# Patient Record
Sex: Male | Born: 1935 | Race: White | Hispanic: No
Health system: Southern US, Community
[De-identification: ages and names within clinical notes are randomized; demographics above are authoritative.]

---

## 2005-01-09 ENCOUNTER — Inpatient Hospital Stay: Payer: Self-pay

## 2005-01-09 ENCOUNTER — Other Ambulatory Visit: Payer: Self-pay

## 2005-05-12 ENCOUNTER — Emergency Department: Payer: Self-pay | Admitting: General Practice

## 2005-05-12 ENCOUNTER — Other Ambulatory Visit: Payer: Self-pay

## 2005-08-14 ENCOUNTER — Inpatient Hospital Stay: Payer: Self-pay | Admitting: Internal Medicine

## 2005-08-14 ENCOUNTER — Other Ambulatory Visit: Payer: Self-pay

## 2005-08-23 ENCOUNTER — Other Ambulatory Visit: Payer: Self-pay

## 2005-08-29 ENCOUNTER — Emergency Department: Payer: Self-pay | Admitting: Emergency Medicine

## 2005-09-01 ENCOUNTER — Other Ambulatory Visit: Payer: Self-pay

## 2005-09-01 ENCOUNTER — Ambulatory Visit: Payer: Self-pay | Admitting: Specialist

## 2005-09-05 ENCOUNTER — Ambulatory Visit: Payer: Self-pay | Admitting: Specialist

## 2007-03-07 ENCOUNTER — Emergency Department: Payer: Self-pay | Admitting: Emergency Medicine

## 2007-07-15 IMAGING — CT CT ABD-PELV W/ CM
1 of 4 series · 11 of 32 positions shown, 17 images · non-contrast
Comparison: none

REASON FOR EXAM: further assess left renal mass
COMMENTS:

[Series 4: delay · axial · delayed · 0.63mm/px · z∈[-470,-70]mm · 11 of 98 slices shown, 17 images]
[im 9/98  soft-tissue]
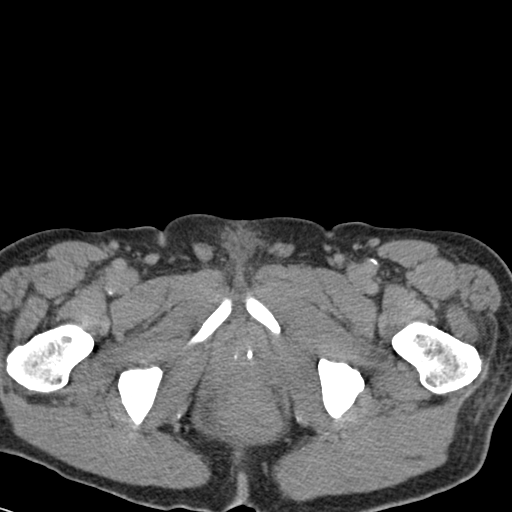
[im 9/98  bone]
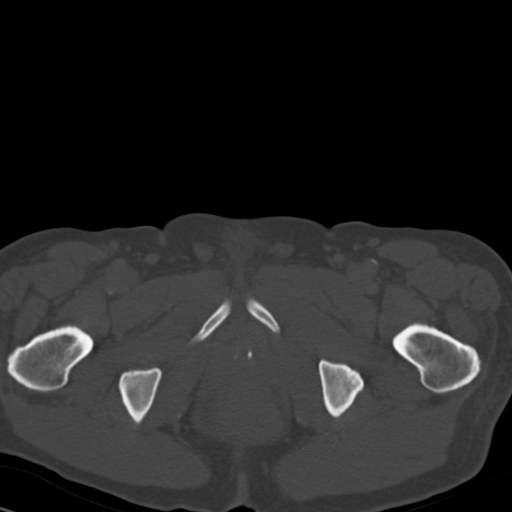
[im 17/98  soft-tissue]
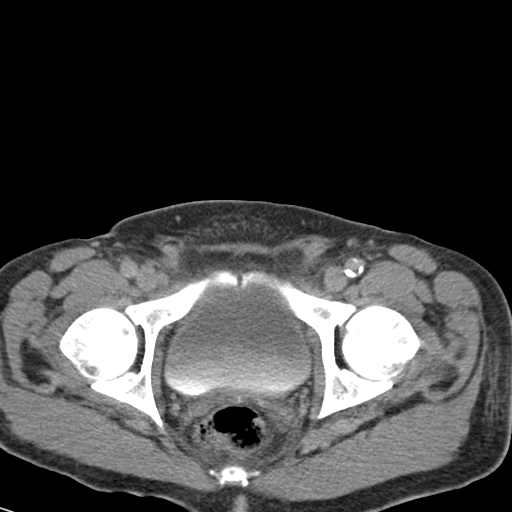
[im 25/98  soft-tissue]
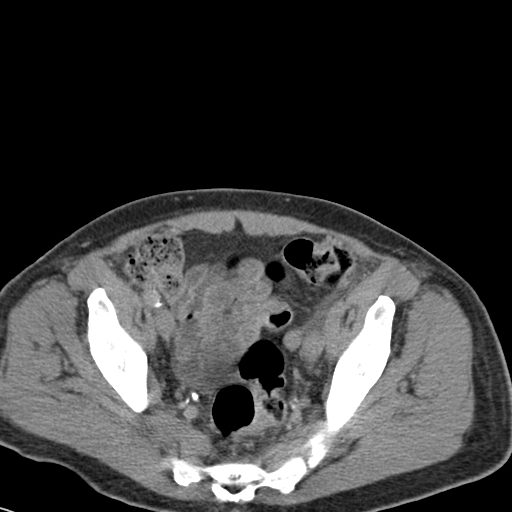
[im 33/98  soft-tissue]
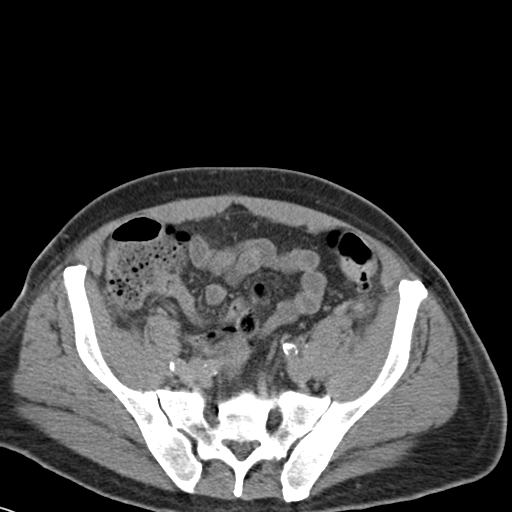
[im 41/98  soft-tissue]
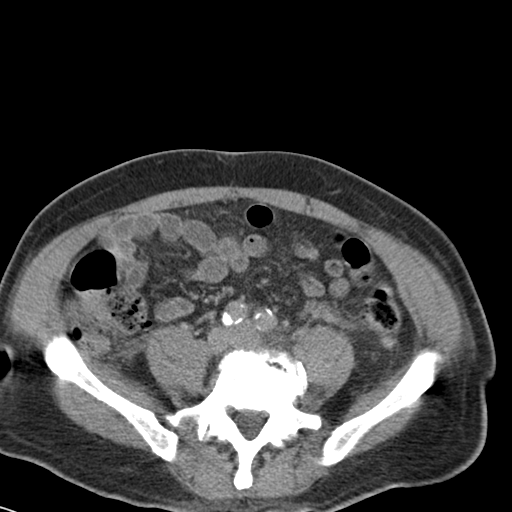
[im 49/98  soft-tissue]
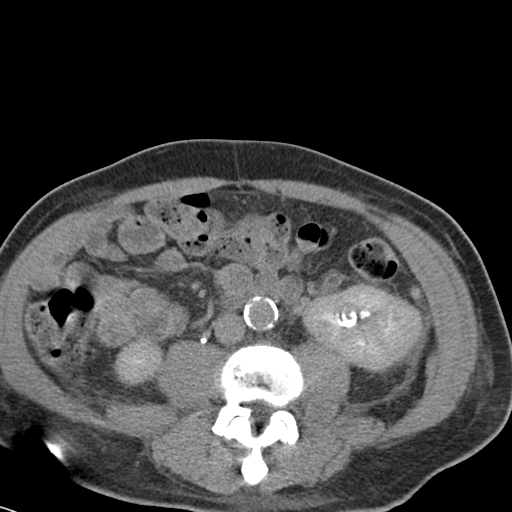
[im 57/98  soft-tissue]
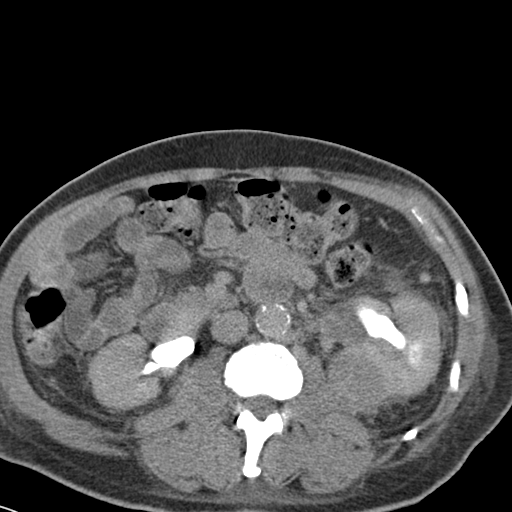
[im 65/98  soft-tissue]
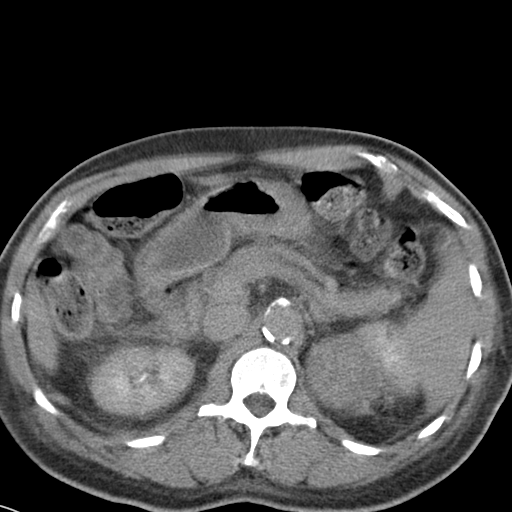
[im 65/98  lung]
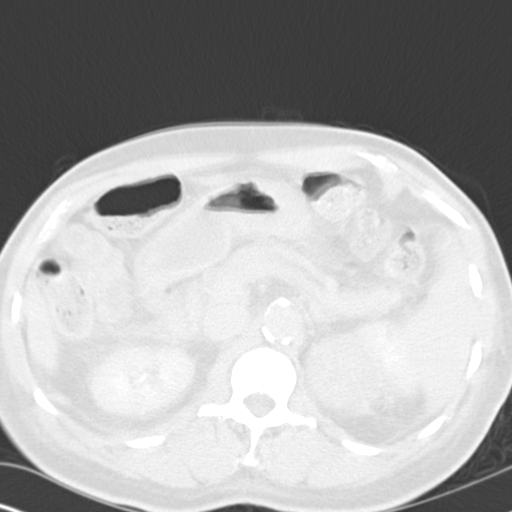
[im 73/98  soft-tissue]
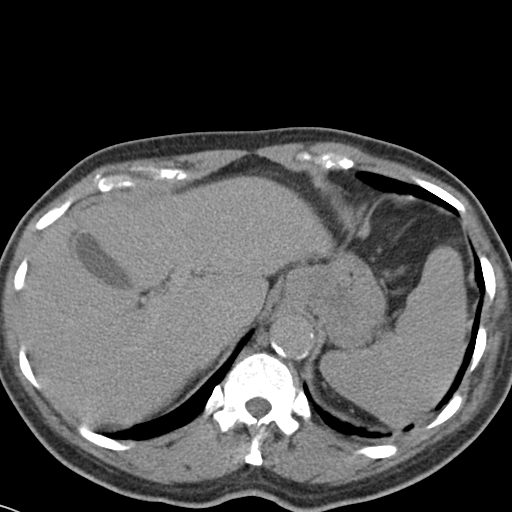
[im 73/98  lung]
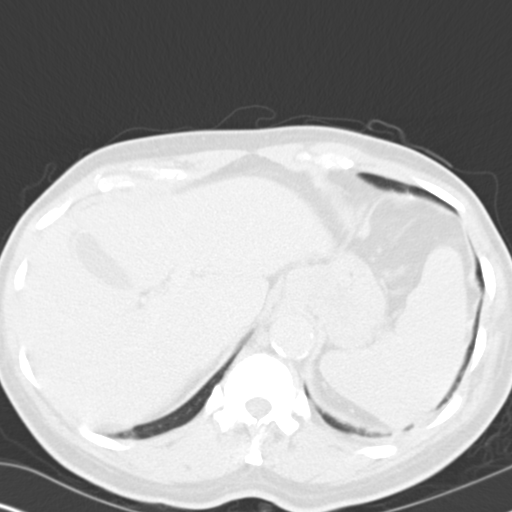
[im 73/98  bone]
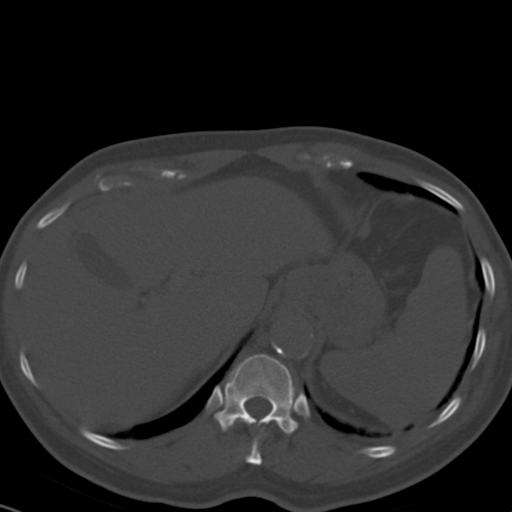
[im 81/98  soft-tissue]
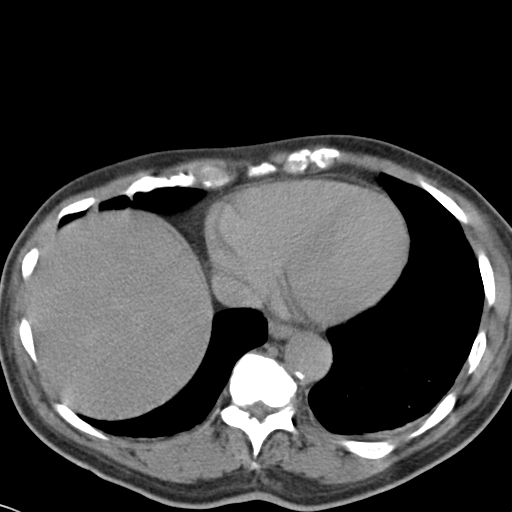
[im 81/98  lung]
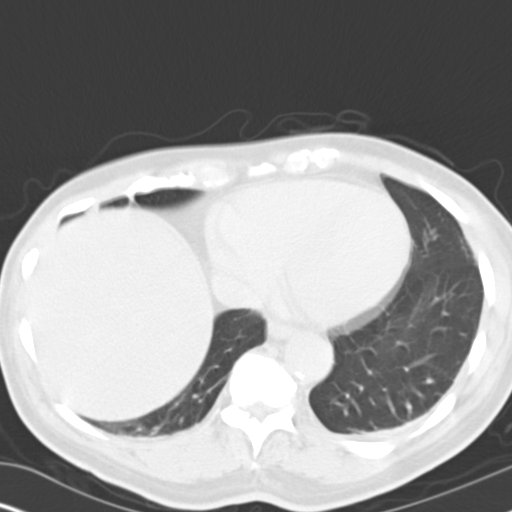
[im 89/98  soft-tissue]
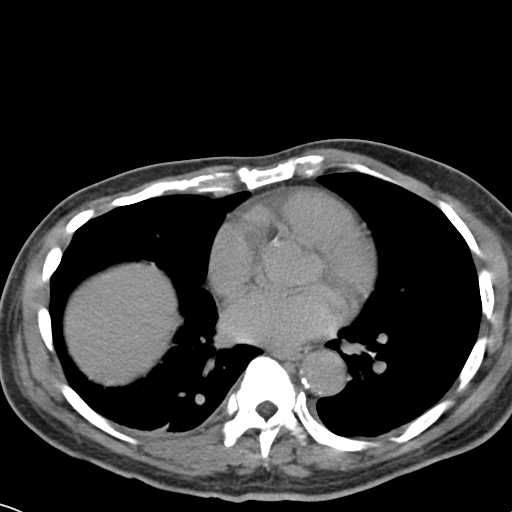
[im 89/98  lung]
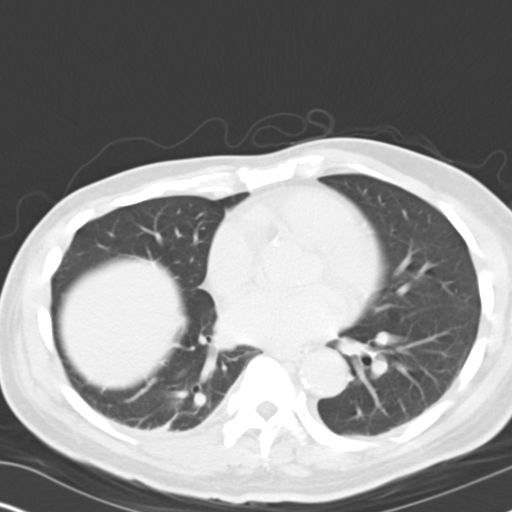

[11 of 32 positions shown; findings below may reference images not displayed]

PROCEDURE:     CT  - CT ABDOMEN / PELVIS  W  - August 20, 2005 [DATE]

RESULT:          IV contrast only CT of the abdomen and pelvis is performed.
 The patient has no prior contrast-enhanced CT for comparison.  Delayed
images were obtained.

The study demonstrates an abnormal appearance of the LEFT kidney with
lobulated areas of enhancement, especially in the upper pole region, and
evidence of hydronephrosis.  There is some calcific density seen on image
#55 measuring approximately 7.5 mm which may represent a LEFT ureteropelvic
junction stone.  Some additional minimal calcification is noted in the renal
parenchyma medially on image #45.  There is also calcification that may be
vascular on image #39.  There is hydronephrosis and perinephric stranding.
There is enlargement of the prostate with some prostatic calcification
present.  On delayed images, the kidneys excrete contrast, but the distended
collecting system does not fill completely on the initial delayed images in
the LEFT kidney.  The stone does not appear to have been present in this
ureteropelvic junction location on the LEFT when compared to the prior
study.  At that point, the density appeared to be in the lower pole caliceal
system.

The RIGHT kidney appears to enhance normally and does not show a definite
mass.  There is no evidence of a urinary tract stone on the RIGHT or
evidence of obstruction.  No definite periaortic or retroperitoneal mass or
adenopathy is seen.  No radiopaque gallstones are evident.  The liver,
spleen, pancreas and adrenal glands appear to enhance normally.  The aorta
contains atherosclerotic calcification, but there is no evidence of
aneurysmal dilatation.  No abnormal bowel distention is seen.
IMPRESSION: 1.     Findings consistent of an almost 8 mm long stone in the LEFT
ureteropelvic junction region with hydronephrosis.  The renal pelvis never
fully opacified on the delayed images, and therefore other filling defects
are difficult to evaluate.  Correlation with cystoscopy and possibly
ureteroscopy would be suggested.
2.     There is mass involving the LEFT kidney medially and superiorly which
appears to enhance and washout relatively quickly consistent with carcinoma,
possibly renal cell.  No definite RIGHT renal lesion is seen.  There is no
evidence of retroperitoneal mass or adenopathy.  The mass measures up to
about 6 cm in an oblique anterior to posterior direction.  Urologic
consultation is recommended.

## 2009-05-07 ENCOUNTER — Emergency Department: Payer: Self-pay | Admitting: Emergency Medicine

## 2012-03-14 ENCOUNTER — Ambulatory Visit: Payer: Self-pay | Admitting: Family Medicine

## 2012-03-25 ENCOUNTER — Ambulatory Visit: Payer: Self-pay | Admitting: Neurology

## 2014-04-06 ENCOUNTER — Inpatient Hospital Stay: Payer: Self-pay | Admitting: Internal Medicine

## 2014-05-26 ENCOUNTER — Emergency Department
Admit: 2014-05-26 | Disposition: A | Discharge status: Other Acute Inpatient Hospital | Payer: Self-pay | Admitting: Emergency Medicine

## 2014-05-26 LAB — COMPREHENSIVE METABOLIC PANEL
ALBUMIN: 2.8 g/dL — AB
ANION GAP: 9 (ref 7–16)
AST: 24 U/L
Alkaline Phosphatase: 82 U/L
BUN: 33 mg/dL — ABNORMAL HIGH
Bilirubin,Total: 0.8 mg/dL
CO2: 26 mmol/L
Calcium, Total: 8.5 mg/dL — ABNORMAL LOW
Chloride: 109 mmol/L
Creatinine: 2.18 mg/dL — ABNORMAL HIGH
GFR CALC AF AMER: 31 — AB
GFR CALC NON AF AMER: 27 — AB
GLUCOSE: 209 mg/dL — AB
Potassium: 2.8 mmol/L — ABNORMAL LOW
SGPT (ALT): 17 U/L
Sodium: 144 mmol/L
Total Protein: 7.6 g/dL

## 2014-05-26 LAB — URINALYSIS, COMPLETE
Bilirubin,UR: NEGATIVE
Glucose,UR: NEGATIVE mg/dL (ref 0–75)
KETONE: NEGATIVE
Nitrite: POSITIVE
Ph: 5 (ref 4.5–8.0)
SPECIFIC GRAVITY: 1.005 (ref 1.003–1.030)

## 2014-05-26 LAB — ED INFLUENZA
INFLAPCR: NEGATIVE
Influenza B By PCR: NEGATIVE

## 2014-05-26 LAB — LACTIC ACID, PLASMA
LACTIC ACID, VENOUS: 1.5 mmol/L
Lactic Acid, Venous: 2.3 mmol/L

## 2014-05-26 LAB — CBC WITH DIFFERENTIAL/PLATELET
BASOS PCT: 0.2 %
Basophil #: 0 10*3/uL (ref 0.0–0.1)
Eosinophil #: 0 10*3/uL (ref 0.0–0.7)
Eosinophil %: 0.2 %
HCT: 28.8 % — ABNORMAL LOW (ref 40.0–52.0)
HGB: 9.3 g/dL — AB (ref 13.0–18.0)
LYMPHS ABS: 0.8 10*3/uL — AB (ref 1.0–3.6)
Lymphocyte %: 12.3 %
MCH: 27.8 pg (ref 26.0–34.0)
MCHC: 32.3 g/dL (ref 32.0–36.0)
MCV: 86 fL (ref 80–100)
MONO ABS: 0.4 x10 3/mm (ref 0.2–1.0)
Monocyte %: 5.9 %
Neutrophil #: 5.4 10*3/uL (ref 1.4–6.5)
Neutrophil %: 81.4 %
Platelet: 204 10*3/uL (ref 150–440)
RBC: 3.35 10*6/uL — ABNORMAL LOW (ref 4.40–5.90)
RDW: 15.6 % — ABNORMAL HIGH (ref 11.5–14.5)
WBC: 6.7 10*3/uL (ref 3.8–10.6)

## 2014-05-26 LAB — TROPONIN I
Troponin-I: 0.04 ng/mL — ABNORMAL HIGH
Troponin-I: 0.05 ng/mL — ABNORMAL HIGH

## 2014-05-26 LAB — PHOSPHORUS: PHOSPHORUS: 1.9 mg/dL — AB

## 2014-05-26 LAB — PROTIME-INR
INR: 1.2
PROTHROMBIN TIME: 15.4 s — AB

## 2014-05-26 LAB — MAGNESIUM: MAGNESIUM: 1.7 mg/dL

## 2014-05-28 LAB — URINE CULTURE

## 2014-05-31 LAB — CULTURE, BLOOD (SINGLE)

## 2014-06-14 NOTE — Consult Note (Signed)
Psychiatry: Came by to see patient this evening but he was off the floor at radiology.  I will follow-up tomorrow morning.  Electronic Signatures: Tija Biss, Jackquline DenmarkJohn T (MD)  (Signed on 23-Feb-16 18:58)  Authored  Last Updated: 23-Feb-16 18:58 by Audery Amellapacs, Joffre Lucks T (MD)

## 2014-06-14 NOTE — Consult Note (Signed)
Brief Consult Note: Diagnosis: Proctitis, persistent diarrhea.   Patient was seen by consultant.   Comments: Mr. Dwayne Dominguez is a very pleasant 81 y/o male patient of the [facility] in [place] with recent hospitalization for GI bleed which he states was secondary to PUD & hx colon cancer about 5-6 years ago who presents with 2 1/2 weeks of profuse diarrhea & fecal incontinence.  Pt had oral contrast only CT that shows rectal wall thickening with questionable fistula between rectum & poster perineum with irregularity suggesting decubitus ulceration.  No large decubiti visualized on rectal exam.  Differentials include acute proctitis/colitis secondary to infection, ischemia, or recurrent malignancy.    Plan: 1) Agree with full set of stool studies 2) Request [facility] records 3) Supportive measures 4) Consider flexible sigmoidoscopy versus complete colonoscopy with Dr Servando SnareWohl.  Discussed risks/benefits of procedure which include but are not limited to bleeding, infection, perforation & drug reaction.  I will discuss with Dr Servando SnareWohl further 5) Follow H/H 6) Protonix 40mg  BID given recent UGI bleed history Thanks for allowing us to participate in his care.  Please see full dictated note. #[id number].  Electronic Signatures: Joselyn ArrowJones, Kayleah Appleyard L (NP)  (Signed [phone] 366322-Feb-16 16:35)  Authored: Brief Consult Note   Last Updated: 22-Feb-16 16:35 by Joselyn ArrowJones, Brentt Fread L (NP)

## 2014-06-14 NOTE — Consult Note (Signed)
PATIENT NAME:  Dwayne DittyIERCE, Dwayne W MR#:  [id number] DATE OF BIRTH:  1926-10-12  DATE OF CONSULTATION:  04/06/2014  REQUESTING PHYSICIAN:  Alford Highlandichard Wieting, MD CONSULTING PHYSICIAN:  Joselyn ArrowKandice L. Kionna Brier, NP  PRIMARY CARE PHYSICIAN:  [facility] [place].   CONSULTING GASTROENTEROLOGIST: Midge Miniumarren Wohl, MD   PRIMARY GASTROENTEROLOGIST:  [facility] [place]  REASON FOR CONSULTATION: Diarrhea, proctitis on CT.   HISTORY OF PRESENT ILLNESS: Mr. Jeanella Crazeierce is an 81 year old male who is followed by the [facility] in [place].  He gives history of bleeding ulcer and states about a month ago he was admitted to the hospital for 2-1/2 weeks.  He is unsure whether he had an EGD but tells me he was told he had bleeding ulcer.  He then went to [facility] for 2-1/2 weeks and was discharged home.  Over the last 2-1/2 weeks, he has had diarrhea all night and all day. He has oozing incontinence.  He denies any rectal bleeding, melena or mucus in his stools. He gives a history of colon resection for colon cancer 5 to 6 years ago at the [facility], but I do not have these records. He cannot give me details as far as how much of his colon was removed nor whether he had chemotherapy or radiation. He denies any NSAIDs. He does state that he has lost several pounds in the last few weeks.  He has been on iron ferrous sulfate and is on Aggrenox twice daily. He denies nausea and vomiting, heartburn or indigestion, dysphagia or odynophagia. He reports his last colonoscopy was at the [facility] last year and was negative.  He had a CT scan with oral contrast only given his allergies to IV contrast.  It showed thickening of the rectal wall concerning for proctitis, which may warrant direct visualization, a questionable fistula between the rectum and the posterior perineum with irregularity in the wall of peritoneum suggesting decubitus ulcerations, left testes is seen in the inguinal canal, status post left nephrectomy, scattered sigmoid  diverticula without diverticulitis, extensive atherosclerotic change, and a ventral hernia containing only fat.   PAST MEDICAL AND SURGICAL HISTORY: Iron deficiency anemia, renal cancer status post left nephrectomy, colon cancer status post partial colectomy, CVA, right eye prosthesis, renal lithiasis, chronic kidney disease, diabetes mellitus, gout, hypertension and hypoalbuminemia.    MEDICATIONS PRIOR TO ADMISSION:  Allopurinol 100 mg daily, Norvasc 10 mg daily, vitamin C daily, Aggrenox 1 tablet b.i.d., Lipitor 80 mg at bedtime, iron 325 mg b.i.d., hydrochlorothiazide 25 mg daily, lisinopril 40 mg daily, melatonin 3 mg at bedtime, metoprolol-ER 25 mg daily 70-30 insulin, unknown dose.  ALLERGIES: IV CONTRAST DYE.   FAMILY HISTORY: Father deceased at age 355 due to tuberculosis. Mother deceased at 2592 due to old age.  No known family history of carcinoma, liver or chronic GI problems.   SOCIAL HISTORY: He is a widower lives alone. He has someone who comes and helps with cooking and cleaning around the house. He was previously a Runner, broadcasting/film/videoteacher at [employer]. He denies any tobacco. He rarely consumes alcohol and denies any illicit drug use.  REVIEW OF SYSTEMS:   NEUROLOGIC:  He has chronic left-sided deficits status post CVA.    HEENT: He has decreased hearing.  MUSCULOSKELETAL: He has chronic knee pain and says he needs knee replacements, but states he is too old for this. All other is negative except as mentioned in HPI.  PHYSICAL EXAMINATION:   VITAL SIGNS:  BMI 26.5, weight 185 pounds, height 70 inches,  temperature 97.5, pulse 96, respirations 20, blood pressure 131/74, oxygen saturation 99% on room air.  GENERAL: He is an elderly, thin black male who is alert, oriented, pleasant, cooperative. He is accompanied by his cousin today.  HEENT: Sclerae clear, anicteric. Conjunctivae pink. Oropharynx pink and moist without any lesions.  NECK: Supple without any mass or thyromegaly.  CHEST:  Heart regular rate and rhythm. Normal S1, S2 without murmurs, clicks, rubs, or gallops.  LUNGS: Clear to auscultation bilaterally.  ABDOMEN: Positive bowel sounds x 4. No bruits auscultated. Abdomen is soft, nondistended, nontender, without palpable mass or hepatosplenomegaly. No rebound, tenderness or guarding.  RECTAL: No external lesions or decubiti visualized and internal exam was not performed as he was found to be Hemoccult positive from the Emergency Room staff.  EXTREMITIES:  No edema. No cyanosis.  NEUROLOGIC: Left-sided deficits with musculoskeletal left-sided weakness, good 5/5 strength on the right, 3/5 strength on the left.  NEUROLOGIC: Grossly intact. He is alert and oriented x 4.  PSYCHIATRIC: Alert, cooperative, normal mood and affect.   LABORATORY STUDIES: Glucose 149, BUN 37, creatinine 2.13, chloride 108, otherwise normal basic metabolic panel. Albumin 3, otherwise normal LFTs. Troponin was negative. Hemoglobin 9.6, hematocrit 29.9, platelets normal.  White blood cell count normal.   IMPRESSION: Mr. Falzone is a very pleasant 81 year old male patient of the [address] [facility] in [place], [place] with a recent hospitalization for gastrointestinal bleed which he states was secondary to peptic ulcer disease and a history of colon cancer about 5 to 6 years ago who presents with 2-1/2 weeks of profuse diarrhea and fecal incontinence. The patient had oral contrast only. CT that showed rectal wall thickening with questionable fistula between rectal and posterior perineum with irregularity suggesting decubitus ulceration. No large decubiti visualized on rectal exam.  Differentials include acute proctitis colitis secondary to infection, ischemia, or recurrent malignancy.   PLAN:  1.  Agree with full set of stool studies.  2.  Request [facility] records.  3.  Supportive measures.  4.  Consider flexible sigmoidoscopy versus complete colonoscopy with Dr. Servando Snare.  5.  Follow hemoglobin and  hematocrit.  6.  Protonix 40 mg b.i.d. given recent upper gastrointestinal bleed.   Thank you for allowing [place] to participate in his care.      ____________________________ Joselyn Arrow, NP klj:DT D: 04/06/2014 16:34:11 ET T: 04/06/2014 17:23:48 ET JOB#: [id number]  cc: Joselyn Arrow, NP, <Dictator> [facility]     Joselyn Arrow FNP ELECTRONICALLY SIGNED 04/16/2014 21:11

## 2014-06-14 NOTE — H&P (Signed)
PATIENT NAME:  Dwayne Dominguez, Dwayne Dominguez MR#:  [id number] DATE OF BIRTH:  [birth year]  DATE OF ADMISSION:  04/06/2014  [facility]  CHIEF COMPLAINT: Diarrhea for the past 2-1/2 weeks, mostly going on at night.   HISTORY OF PRESENT ILLNESS: This is an 81 year old man who states he was recently discharged from the [facility] with a bleeding ulcer and was over at [facility] for while and now back home. He states for the past 2-1/2 weeks when he goes to bed he has been having oozing sensation from his bowels, not getting any better. He is having diarrhea after meals. He had a CT scan that was done today that showed possible proctitis. ER physician had him guaiac positive black stool. Hospitalist services were contacted for further evaluation. The patient is not having any abdominal pain. No nausea or vomiting. His diarrhea is the major complaint.    PAST MEDICAL HISTORY: Kidney cancer, colon cancer status post resection, right eye prosthesis, kidney stones, chronic kidney disease, diabetes, hypertension.   PAST SURGICAL HISTORY: Kidney removal, colon resection and kidney stone surgery.   ALLERGIES: No known drug allergies.   MEDICATIONS: As per prescription writer: Allopurinol 100 mg daily, Norvasc 10 mg daily, vitamin C daily, Aggrenox 1 tablet twice a day, Lipitor 80 mg at bedtime, iron 325 mg twice a day, hydrochlorothiazide 25 mg daily, lisinopril 40 mg daily, melatonin 3 mg at bedtime, metoprolol ER 25 mg daily, 70/30 insulin unknown dose.   SOCIAL HISTORY: No smoking. Occasional alcohol. No drug use. Worked as a Runner, broadcasting/film/videoteacher in the past. Lives by himself.   FAMILY HISTORY: Father at 4155 died of TB. Mother died at 3392 of old age.   REVIEW OF SYSTEMS: CONSTITUTIONAL: Positive for weight loss. No fever, chills, or sweats.  EYES: He does wear glasses.  EARS, NOSE, MOUTH AND THROAT: Decreased hearing. No sore throat. No difficulty swallowing.  CARDIOVASCULAR: No chest pain. No  palpitations.  RESPIRATORY: No shortness of breath. No cough. No sputum. No hemoptysis.  GASTROINTESTINAL: Positive for diarrhea. No abdominal pain. No nausea. No vomiting. That he sees, no blood in the stool.  GENITOURINARY: No burning on urination or hematuria.  MUSCULOSKELETAL: No joint pain or muscle pain.  INTEGUMENT: No rashes or eruptions.  NEUROLOGIC: No fainting or blackouts.  PSYCHIATRIC: No anxiety or depression.  ENDOCRINE: No thyroid problems.  HEMATOLOGIC AND LYMPHATIC: Positive for anemia.   PHYSICAL EXAMINATION:  VITAL SIGNS: Stable and reviewed prior to seeing the patient, but computers are down so I cannot give you the exact numbers right now.  GENERAL: No respiratory distress.  EYES: Conjunctivae and lids normal. Pupils equal, round, and reactive to light. Extraocular muscles intact. No nystagmus.  EARS, NOSE, MOUTH AND THROAT: Tympanic membranes: No erythema. Nasal mucosa: No erythema. Throat: No erythema. No exudate seen. Lips and gums: No lesions.  NECK: No JVD. No bruits. No lymphadenopathy. No thyromegaly. No thyroid nodules palpated.  RESPIRATORY: Lungs clear to auscultation. No use of accessory muscles to breathe. No rhonchi, rales, or wheeze heard.  CARDIOVASCULAR: S1, S2 normal. A +2/6 systolic ejection murmur. Carotid upstroke 2+ bilaterally. No bruises. Dorsalis pedis pulses 1+ bilaterally. No edema of the lower extremity.  ABDOMEN: Soft, nontender. No organosplenomegaly. Normoactive bowel sounds. ER physician stated he did a rectal that was black stool, guaiac-positive.  EXTREMITIES: No clubbing, cyanosis, edema.  INTEGUMENT: No rashes or eruptions.  NEUROLOGIC: Cranial nerves II through XII grossly intact. Deep tendon reflexes 2+ bilateral lower extremities.  PSYCHIATRIC:  The patient is alert, oriented to person, place, and time.   LABORATORY AND RADIOLOGICAL DATA: I do not have access to the computer so I cannot tell you all the laboratory data at this point.  I wrote down creatinine 2.13, hemoglobin 9.6.  As per the ER physician, CT scan showed proctitis and colonic thickening.   EKG: First-degree AV block, right bundle branch block, left axis deviation.   ASSESSMENT AND PLAN: 1.  Diarrhea with possible proctitis, possible gastrointestinal bleed. We will admit to medicine, get a gastroenterology consultation, empirically put on Cipro and Flagyl. Get stool culture, stool for Clostridium difficile, stool leukocytes. Serial hemoglobins and continue to monitor closely.  2.  Recent gastrointestinal bleed over at the [place]. Try to obtain records.  3.  Hypertension history. Continue Norvasc and metoprolol.  4.  Looks like chronic kidney disease. Continue to monitor while here.  5.  Gout. On allopurinol.  6.  Hyperlipidemia. On high-dose Lipitor.  7.  Diabetes. We will check finger sticks at this point.  8.  Probable history of stroke. On Aggrenox. Will hold that at this point.  CODE STATUS: FULL.  TIME SPENT ON ADMISSION: 55 minutes.  ____________________________ Herschell Dimes. Renae Gloss, MD rjw:sb D: 04/06/2014 13:38:48 ET T: 04/06/2014 13:46:48 ET JOB#: [id number]  cc: Herschell Dimes. Renae Gloss, MD, <Dictator> [facility] Salley Scarlet MD ELECTRONICALLY SIGNED 04/07/2014 16:40

## 2014-06-14 NOTE — Discharge Summary (Signed)
PATIENT NAME:  Dwayne Dominguez, Dwayne Dominguez MR#:  [id number] DATE OF BIRTH:  01-05-1927  DATE OF ADMISSION:  04/06/2014 DATE OF DISCHARGE:    ADMITTING PHYSICIAN:  Dr. Alford Highlandichard Wieting.    DISCHARGING PHYSICIAN:  Dr. Enid Baasadhika Tonnie Stillman.    PRIMARY CARE PHYSIC[facility]  At [facility].   CONSULTATIONS IN THE HOSPITAL:  1.  GI consultation by Dr. Midge Miniumarren Wohl.  2.  Psychiatric consultation by Dr. Toni Amendlapacs.    DISCHARGE DIAGNOSES:  1. Sepsis from Escherichia coli urinary tract infection.  2. Diarrhea, cultures negative, improved now.  3. Hypertension.  4. Mild to moderate dementia.  5. Gout.  6. Acute on chronic anemia requiring 1 unit packed red blood cell transfusion this admission.  7. Recent Helicobacter pylori ulcer treatment.  8. Chronic kidney disease stage III.  9. Metabolic encephalopathy.  10. History of cerebrovascular accident with right-sided weakness.  11. 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  12. History of colon cancer status post right hemicolectomy.  13. History of renal cell carcinoma status post nephrectomy.   DISCHARGE MEDICATIONS:  1. Ascorbic acid 500 mg p.o. 3 times a day.  2. Atorvastatin 80 mg p.o. at bedtime.  3. Aggrenox 25/200 mg 1 capsule p.o. b.i.d.  4. Ferrous sulfate 325 mg p.o. 3 times a day.  5. Novolin units 18 subcutaneous once a day in the evening.  6. Novolin insulin 70/30, 22 units in the morning subcutaneous.  7. Lisinopril 40 mg p.o. daily.  8. Metoprolol 25 mg p.o. b.i.d.  9. Allopurinol 100 mg p.o. daily.  10. Melatonin 3 mg p.o. at bedtime.  11. Norvasc 5 mg p.o. daily.  12. Protonix 40 mg p.o. b.i.d.  13. Flagyl 500 mg p.o. q. 8 hours for 6 days.  14. Percocet 5/325 mg 1 tablet q. 6 hours p.r.n. for pain.  15. Keflex 250 mg p.o. 3 times a day for 5 more days.      DISCHARGE DIET: Low- sodium diet.    DISCHARGE ACTIVITY: As tolerated.    FOLLOWUP INSTRUCTIONS:  1. PCP follow-up at the [facility] in 1-2  weeks.  2. GI followup for anemia in 2 weeks.  3. Physical therapy.   LABORATORIES AND IMAGING STUDIES PRIOR TO DISCHARGE:  1.  WBC 5.3, hemoglobin 9.3, hematocrit 28.2, platelet count is 127,000.  2.  Sodium 140, potassium 3.5, chloride 109, bicarbonate 21, BUN 17, creatinine 1.8, glucose 156, calcium of 8.4. HbA1c is 6.2.  3.  Urinalysis was nitrite positive, 3 + leukocyte esterase, 3 + bacteria and WBCs, and cultures growing greater than 100,000 colonies of Escherichia coli resistant to fluoroquinolones.   4.  Chest x-ray showing low lung volumes, bibasilar atelectasis.  5.  Blood cultures negative.   6.  Knee x-ray showing degenerative arthritis, no effusion.  7.  Stool cultures are negative. Stool for Clostridium difficile antigen is positive, PCR is negative.  8.  CT of the abdomen and pelvis on admission showing no bowel obstruction, no abscess, status post left nephrectomy, thickening of the rectal wall concerning for proctitis, however flexible sigmoidoscopy was done and proctitis was ruled out.   BRIEF HOSPITAL COURSE: Mr. Jeanella Crazeierce is an 81 year old African-American gentleman with past medical history significant for multiple medical problems including left nephrectomy for renal cell cancer, colon cancer status post resection, CKD, diabetes, hypertension, anemia with [facility] for EGD and colonoscopy showing Helicobacter pylori and gastric ulcers, status post blood  transfusions at the time, was discharged from [facility] hospital and went to rehabilitation, however signed out from rehabilitation and went home. He comes to the hospital secondary to diarrhea and weakness.   1.  Diarrhea, could have been from antibiotics that were given at the [facility] for his proctitis.  His Clostridium difficile was negative for PCR but GDH antigen came positive, either way started on Flagyl with improvement in his symptoms.  So he is being discharged on Flagyl to finish out the 10  day course.  At this time he does not have diarrhea. He had a complete colonoscopy done at the [facility] hospital in January 2016 which was normal except for they recommended 3 years surveillance with his history of colon cancer in the past. Flexible sigmoidoscopy was attempted here in the hospital, however nonconclusive, but no proctitis noted as noted on the CAT scan, but it was a poor preparation. His diarrhea stopped, he is able to eat and no issues.  2.  Sepsis from Escherichia coli UTI. The patient had proctitis and was treated with antibiotics, however had fevers again, cultures were done from the urine and it grew Escherichia coli which are related to  fluoroquinolones. His antibiotics were change to Rocephin here in the hospital and he is being discharged on Keflex.  3.  Metabolic encephalopathy with a history of dementia. The patient has mild-to-moderate dementia at baseline, was confused day 2 of admission, however this started improving once sepsis improved. He was actually seen by psychiatry to clear him for making his own decisions per daughter's request, however the patient was deemed competent to make his own decisions though sometimes he would have poor judgment. He was oriented and knew the consequences and knew what was going on with him. So psychiatry deemed him competent to make his own medical conditions.  4.  Acute on chronic anemia, known chronic anemia, hemoglobin baseline is around 8. He received 2 units of packed RBC  transfusion during [facility] hospital stay, it seems like he had an EGD which showed Helicobacter pylori positive and gastric ulcer, which was nonbleeding.  He is on PPI b.i.d. which is being continued, ferrous sulfate supplements. He did receive 1 unit packed RBC transfusion and hemoglobin at 9 at this time.  5.  CKD states III, has been stable.  6.  His course has been otherwise uneventful in the hospital. The patient did work with physical therapy who recommended rehabilitation for  him.   DISCHARGE CONDITION: Guarded.   DISCHARGE DISPOSITION: Short-term rehabilitation.   TIME SPENT ON DISCHARGE: 45 minutes.   CODE STATUS: Full code    ____________________________ Enid Baas, MD rk:bu D: 04/10/2014 13:15:13 ET T: 04/10/2014 13:38:48 ET JOB#: [id number]  cc: Enid Baas, MD, <Dictator> [facility] Enid Baas MD ELECTRONICALLY SIGNED 04/30/2014 17:48

## 2014-06-14 NOTE — Consult Note (Signed)
Chief Complaint:  Subjective/Chief Complaint Pt has had 1 loose stool today.  Tmax 101.5. Denies rectal bleeding or melena.  Denies abdominal pain, nausea or vomiting.   VITAL SIGNS/ANCILLARY NOTES: **Vital Signs.:   24-Feb-16 08:08  Vital Signs Type Routine  Temperature Temperature (F) 97.9  Celsius 36.6  Temperature Source oral  Pulse Pulse 100  Respirations Respirations 19  Systolic BP Systolic BP 951  Diastolic BP (mmHg) Diastolic BP (mmHg) 64  Mean BP 83  Pulse Ox % Pulse Ox % 97  Pulse Ox Activity Level  At rest  Oxygen Delivery Room Air/ 21 %   Brief Assessment:  GEN no acute distress, thin, +chronically ill appearing   Cardiac Irregular   Gastrointestinal details normal Soft  Nontender  Nondistended  No gaurding  No rigidity   EXTR +clubbing, chronic left-sided weakness   Additional Physical Exam Skin: warm, dry   Lab Results: Routine Chem:  24-Feb-16 03:56   Glucose, Serum  126  BUN  22  Creatinine (comp)  1.97  Sodium, Serum 139  Potassium, Serum 3.7  Chloride, Serum 107  CO2, Serum 25  Calcium (Total), Serum  8.2  Anion Gap 7  Osmolality (calc) 282  eGFR (African American)  42  eGFR (Non-African American)  35 (eGFR values <65m/min/1.73 m2 may be an indication of chronic kidney disease (CKD). Calculated eGFR, using the MRDR Study equation, is useful in  patients with stable renal function. The eGFR calculation will not be reliable in acutely ill patients when serum creatinine is changing rapidly. It is not useful in patients on dialysis. The eGFR calculation may not be applicable to patients at the low and high extremes of body sizes, pregnant women, and vegetarians.)  Routine UA:  24-Feb-16 11:52   Color (UA) Yellow  Clarity (UA) Hazy  Glucose (UA) 50 mg/dL  Bilirubin (UA) Negative  Ketones (UA) Trace  Specific Gravity (UA) 1.011  Blood (UA) 1+  pH (UA) 5.0  Protein (UA) Negative  Nitrite (UA) Positive  Leukocyte Esterase (UA) 3+  (Result(s) reported on 08 Apr 2014 at 12:30PM.)  RBC (UA) 3 /HPF  WBC (UA) 25 /HPF  Bacteria (UA) 3+  Epithelial Cells (UA) <1 /HPF  WBC Clump (UA) PRESENT  Mucous (UA) PRESENT  Hyaline Cast (UA) 2 /LPF (Result(s) reported on 08 Apr 2014 at 12:30PM.)  Routine Hem:  24-Feb-16 03:56   WBC (CBC) 5.3  RBC (CBC)  2.78  Hemoglobin (CBC)  7.9  Hematocrit (CBC)  24.8  Platelet Count (CBC)  114  MCV 89  MCH 28.3  MCHC  31.7  RDW  14.9  Neutrophil % 75.2  Lymphocyte % 12.7  Monocyte % 11.4  Eosinophil % 0.5  Basophil % 0.2  Neutrophil # 4.0  Lymphocyte #  0.7  Monocyte # 0.6  Eosinophil # 0.0  Basophil # 0.0 (Result(s) reported on 08 Apr 2014 at 04:59AM.)   Assessment/Plan:  Assessment/Plan:  Assessment Diarrhea:  Improved.  Stools studies benign.  I susupect he may have had overflow incontinence.  Flex sig had poor prep but no proctitis as seen on CT Anemia: Hgb has dropped 1 gram so I suspect dilutional.  No recurrent GI bleeding noted.   Plan 1) Daily fiber 2) continue Protonix 481mBID 3) follow H/H 4) [facility] Please call if you have any questions or concerns   Electronic Signatures: JoAndria MeuseNP)  (Signed 24-Feb-16 12:34)  Authored: Chief Complaint, VITAL SIGNS/ANCILLARY NOTES,  Brief Assessment, Lab Results, Assessment/Plan   Last Updated: 24-Feb-16 12:34 by Andria Meuse (NP)

## 2014-06-14 NOTE — Consult Note (Signed)
PATIENT NAME:  Dwayne Dominguez, Dwayne Dominguez MR#:  [id number] DATE OF BIRTH:  1926/04/16  DATE OF CONSULTATION:  04/08/2014  REFERRING PHYSICIAN:   CONSULTING PHYSICIAN:  Audery AmelJohn T. Dondrea Clendenin, MD  IDENTIFYING INFORMATION AND REASON FOR CONSULTATION: This is an 81 year old man currently in the hospital for a possible GI bleed. Consultation is for capacity to make medical decisions.   HISTORY OF PRESENT ILLNESS: Information obtained from the patient and the chart. The patient came into the hospital for a GI bleed. Social work evidently had spoken to the family who are concerned that the patient is not taking care of himself. They had raised the possibility that the patient was not able to make medical decisions. The patient himself is able to tell me why he is in the hospital. He is able to tell me that [facility] and had been there previously. He remembers having been at [facility] recently. He is able to tell me that he got a colonoscopy and also has had some surgery on his knees. The patient says his mood is fine. Denies feeling depressed. Says that he does not usually have much trouble sleeping. Denies any psychotic symptoms. Says he does not take any psychiatric medicine and does not think of himself as having any mental health problems. The patient says that he lives by himself, but that he has a next door neighbor who he employed in that she comes every day to check on him and help him prepare food and takes him for rides to his doctor's appointment. The patient admits that the idea of going to an assisted living facility has been raised more than once and he is able to articulate rationalizations why it would be helpful and necessary and says that he is considering the possibility.   PAST PSYCHIATRIC HISTORY: Denies any history of any mental health or psychiatric problems in the past. No evident history of mental illness I can see.   PAST MEDICAL HISTORY: The patient  has high blood pressure, gout, diabetes, recent GI bleed.   SOCIAL HISTORY: The patient is widowed. He has 2 adult children who do not live nearby. Lives by himself, but has a neighbor who helps him out. Says he does not drive anymore because he knows he is not able to.    The patient is very proud of his [employer] service and talks about it quite a bit.   SUBSTANCE ABUSE HISTORY: Denies any history of alcohol or drug abuse.   REVIEW OF SYSTEMS: The patient says he is having some pain in his legs, also guts are still not feeling great, but does not have any other specific complaints. Denies any mental health complaints.   MENTAL STATUS EXAMINATION: This is an 81 year old man who looks his stated age. He was cooperative and pleasant with the interview. Eye contact was good. Psychomotor activity was a little bit slow. Speech was easy to understand in tone, but was very slow. His thoughts were extremely slow with a lot of latency and at times almost thought blocking, but he was always able to get back to the topic eventually. There was no evidence of delusions. I could not catch him in anything he was obviously confabulating. Denies auditory or visual hallucinations. Denies suicidal or homicidal ideation. The patient was able to repeat 3 words with quite a bit of effort initially and could not remember any of them at 3 minutes. He is alert and oriented x 4.  I asked the patient to describe for me the decision process that he would go through if someone were to purpose a particular treatment such as a surgery to him. The patient understood my example and was able to tell me that he would want to know what the potential mortality would be and how it might possibly help him to get the procedure before he made a decision. He states that overall he wants to get appropriate medical treatment. As I mentioned above, he is able to articulate the benefits of assisted living in that he knows that he is undefended at night  and that he is having more trouble taking care of himself.   LABORATORY RESULTS: Urinalysis as of today still showing a significant urinary tract infection and glucoses are running mid-100s to 200s. Has a chronic or maybe acute anemia. Hematocrit and hemoglobin both low. Elevated creatinine 1.7. Multiple obvious abnormalities.   VITAL SIGNS: Blood pressure 133/67, respirations 19, pulse 97, temperature 100.   ASSESSMENT: This is an 81 year old man who clearly has some degree of dementia, although I did not do a full Folstein Mini-Mental status examination. I would say he probably has at least a mild and possibly moderate degree of dementia. On the other hand, he is also alert and oriented, understands his situation fine and is able to interact quite appropriately. Most importantly, he is able to describe his medical problems in reasonable layman's terms and describe appropriate treatment for them and also describe to me his thinking process about making decisions in a way that is reasonable. At this point, I would say that while he does have dementia that he still retains capacity to make medical decisions.   TREATMENT PLAN: No indication for any specific treatment plan right now. We could add Aricept, but rather than adding anything with any potential new side effects, I will defer that while he is getting his medical problems addressed.   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   SECONDARY DIAGNOSES: AXIS I: No further.  AXIS II: No diagnosis.  AXIS III: Multiple medical problems see above.    ____________________________ Audery Amel, MD jtc:at D: 04/08/2014 18:31:05 ET T: 04/08/2014 18:41:37 ET JOB#: [id number]  cc: Audery Amel, MD, <Dictator> Audery Amel MD ELECTRONICALLY SIGNED 04/21/2014 10:36

## 2014-06-14 NOTE — Consult Note (Signed)
Chief Complaint:  Subjective/Chief Complaint RN notes no further diarrhea, rectal bleeding or melena.  Denies abdominal pain, nausea or vomiting.  Extensive records reviewed from [place] hospitalization.   VITAL SIGNS/ANCILLARY NOTES: **Vital Signs.:   25-Feb-16 11:20  Vital Signs Type 15 min Post Blood Start Time  Temperature Temperature (F) 98.2  Celsius 36.7  Pulse Pulse 84  Respirations Respirations 16  Systolic BP Systolic BP 283  Diastolic BP (mmHg) Diastolic BP (mmHg) 70  Mean BP 86  Pulse Ox % Pulse Ox % 99   Brief Assessment:  GEN no acute distress, thin, A/Ox3.  +chronically ill appearing   Cardiac Irregular   Gastrointestinal details normal Soft  Nontender  Nondistended  No gaurding  No rigidity   EXTR +clubbing, chronic left-sided weakness   Additional Physical Exam Skin: warm, dry   Lab Results: Routine Chem:  25-Feb-16 03:52   Glucose, Serum  117  BUN  21  Creatinine (comp)  2.24  Sodium, Serum 144  Potassium, Serum 3.7  Chloride, Serum  109  Calcium (Total), Serum  8.2  Anion Gap 11  Osmolality (calc) 291  eGFR (African American)  36  eGFR (Non-African American)  30 (eGFR values <16m/min/1.73 m2 may be an indication of chronic kidney disease (CKD). Calculated eGFR, using the MRDR Study equation, is useful in  patients with stable renal function. The eGFR calculation will not be reliable in acutely ill patients when serum creatinine is changing rapidly. It is not useful in patients on dialysis. The eGFR calculation may not be applicable to patients at the low and high extremes of body sizes, pregnant women, and vegetarians.)  Routine Hem:  25-Feb-16 03:52   WBC (CBC) 4.7  RBC (CBC)  2.45  Hemoglobin (CBC)  7.0  Hematocrit (CBC)  21.5  Platelet Count (CBC)  100  MCV 88  MCH 28.6  MCHC 32.6  RDW  14.7  Neutrophil % 71.1  Lymphocyte % 17.2  Monocyte % 10.4  Eosinophil % 1.0  Basophil % 0.3  Neutrophil # 3.3  Lymphocyte #  0.8  Monocyte # 0.5   Eosinophil # 0.0  Basophil # 0.0 (Result(s) reported on 09 Apr 2014 at 04:24AM.)   Assessment/Plan:  Assessment/Plan:  Assessment 1) Diarrhea: Resolved after admission.  Likely secondary to overflow incontinence.  Flex sig had poor prep but no proctitis as seen on CT. 2) Chronic Anemia: Receiving transfusion 1 unit PRBCs today.  [facility]  He received 2 units PRBCs during that admission.  No evidence of recurrent GI bleeding at this time as no gross melena or bleeding noted.  Hemorrhoids will make him hemoccult positive so unreliable.  Extensive review of medical records including colonoscopy 2012 & right hemicolectomy for colon adenocarcinoma, last colonoscopy & EGD 02/18/14 showing h pylori gastritis & nonbleeding prepyloric ulcer s/p h pylori treatment, & normal colonoscopy with internal hemorrhoids & pancolonic diverticula. I have discussed his care with Dr KTressia Miners  I [facility] her care [facility] our plan of care is below.   Plan 1) Continue Daily fiber indefinitely 2) continue Protonix 495mBID 3) if recurrent GI bleed or he becomes transfusion dependent please reconsult, would consider EGD to re-evaluate PUD 4) [facility] to verify eradication of h pylori Will sign off.  Please call if you have any questions or concerns   Electronic Signatures: JoAndria MeuseNP)  (Signed 25-Feb-16 11:45)  Authored: Chief  Complaint, VITAL SIGNS/ANCILLARY NOTES, Brief Assessment, Lab Results, Assessment/Plan   Last Updated: 25-Feb-16 11:45 by Andria Meuse (NP)

## 2016-04-19 IMAGING — CR DG CHEST 1V PORT
1 series · 1 of 1 positions shown · non-contrast
Comparison: 04/07/2014

CLINICAL DATA: Elevated blood sugar, fever. History of
hypertension.

EXAM:
PORTABLE CHEST - 1 VIEW

[ap]
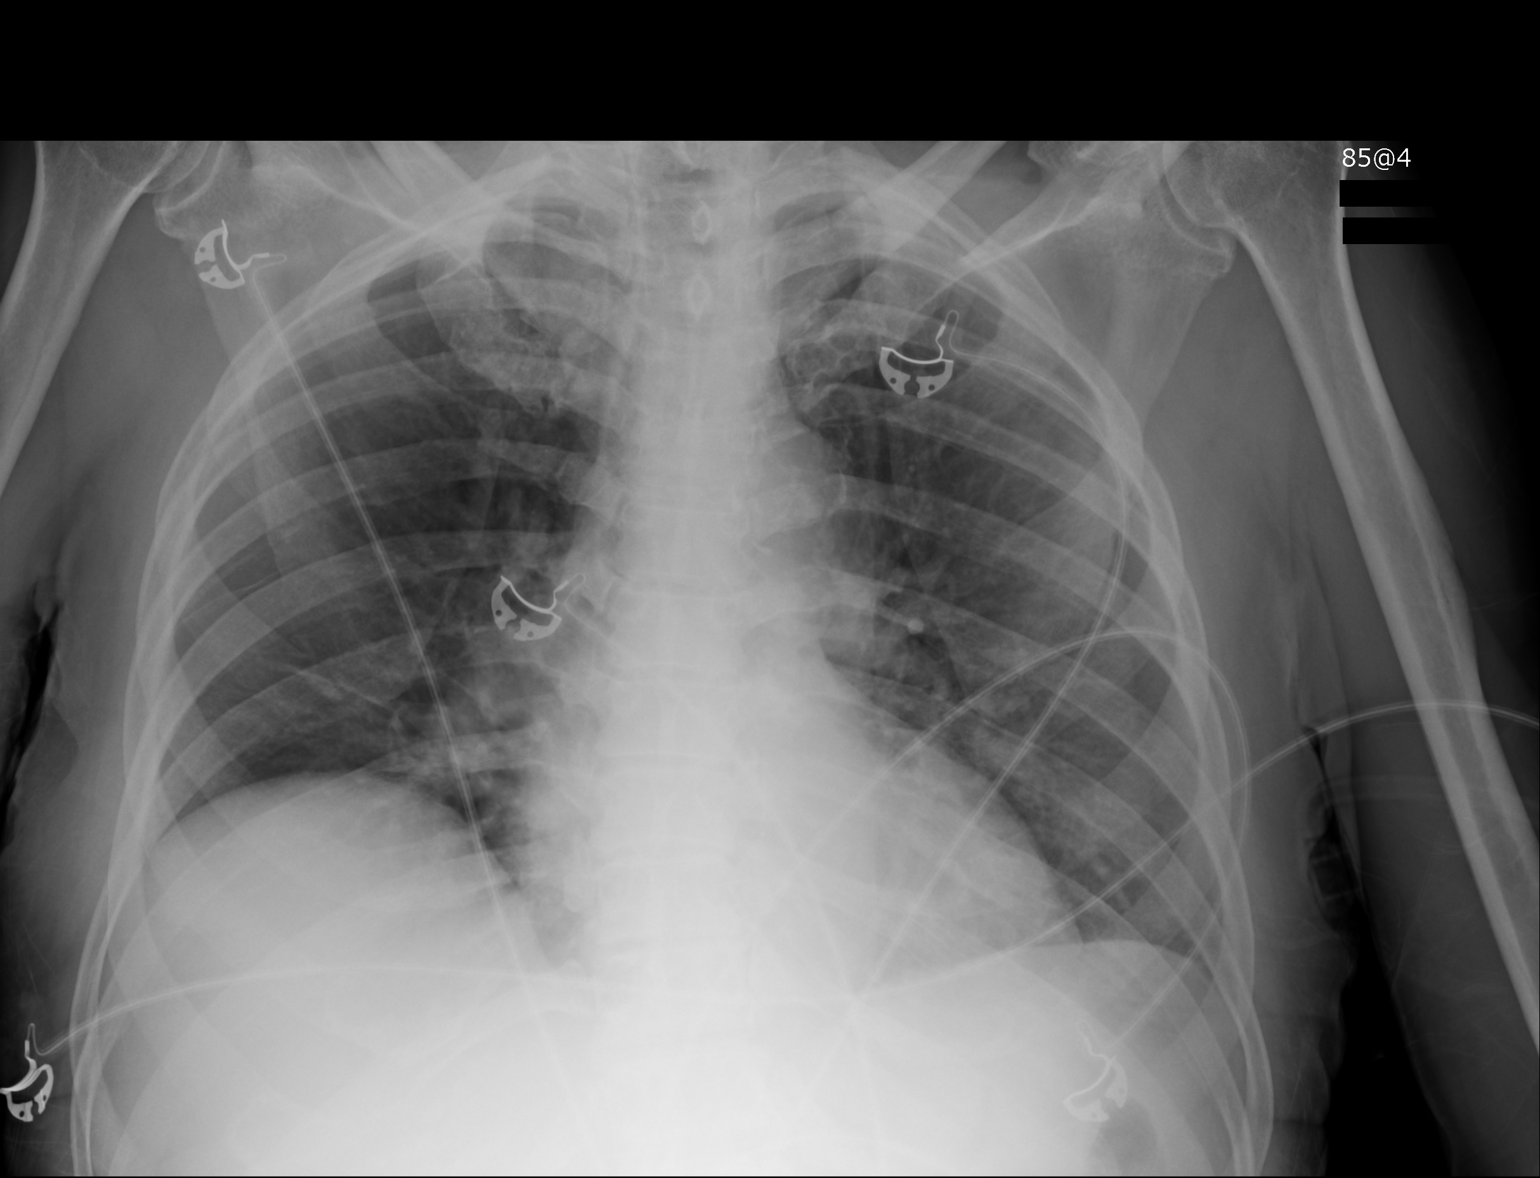

[1 of 1 positions shown; findings below may reference images not displayed]

FINDINGS: The cardiomediastinal contours are normal. Lung volumes are low,
increased bibasilar atelectasis, right greater than left. Pulmonary
vasculature is normal for technique. No consolidation, pleural
effusion, or pneumothorax. A skin fold projects over the left
hemithorax. No acute osseous abnormalities are seen. Degenerative
change noted of the left shoulder.
IMPRESSION: Hypoventilatory chest with bibasilar atelectasis, increased from
prior exam. No consolidation to suggest pneumonia.

## 2020-03-28 ENCOUNTER — Encounter: Payer: Self-pay | Admitting: Physician Assistant

## 2020-03-28 ENCOUNTER — Other Ambulatory Visit: Payer: Self-pay

## 2020-03-28 ENCOUNTER — Emergency Department: Payer: Medicare PPO

## 2020-03-28 ENCOUNTER — Emergency Department
Admission: EM | Admit: 2020-03-28 | Discharge: 2020-03-28 | Disposition: A | Discharge status: Home / Self Care | Payer: Medicare PPO | Attending: Emergency Medicine | Admitting: Emergency Medicine

## 2020-03-28 DIAGNOSIS — W19XXXA Unspecified fall, initial encounter: Secondary | ICD-10-CM

## 2020-03-28 DIAGNOSIS — H1132 Conjunctival hemorrhage, left eye: Secondary | ICD-10-CM | POA: Diagnosis not present

## 2020-03-28 DIAGNOSIS — I6782 Cerebral ischemia: Secondary | ICD-10-CM | POA: Insufficient documentation

## 2020-03-28 DIAGNOSIS — H5789 Other specified disorders of eye and adnexa: Secondary | ICD-10-CM | POA: Diagnosis present

## 2020-03-28 DIAGNOSIS — Y92009 Unspecified place in unspecified non-institutional (private) residence as the place of occurrence of the external cause: Secondary | ICD-10-CM

## 2020-03-28 DIAGNOSIS — Y92019 Unspecified place in single-family (private) house as the place of occurrence of the external cause: Secondary | ICD-10-CM | POA: Diagnosis not present

## 2020-03-28 DIAGNOSIS — G319 Degenerative disease of nervous system, unspecified: Secondary | ICD-10-CM | POA: Insufficient documentation

## 2020-03-28 DIAGNOSIS — W06XXXA Fall from bed, initial encounter: Secondary | ICD-10-CM | POA: Insufficient documentation

## 2020-03-28 DIAGNOSIS — S0990XA Unspecified injury of head, initial encounter: Secondary | ICD-10-CM | POA: Diagnosis present

## 2020-03-28 LAB — BASIC METABOLIC PANEL
Anion gap: 9 (ref 5–15)
BUN: 52 mg/dL — ABNORMAL HIGH (ref 8–23)
CO2: 20 mmol/L — ABNORMAL LOW (ref 22–32)
Calcium: 8.4 mg/dL — ABNORMAL LOW (ref 8.9–10.3)
Chloride: 110 mmol/L (ref 98–111)
Creatinine, Ser: 2.1 mg/dL — ABNORMAL HIGH (ref 0.61–1.24)
GFR, Estimated: 29 mL/min — ABNORMAL LOW (ref >60)
Glucose, Bld: 194 mg/dL — ABNORMAL HIGH (ref 70–99)
Potassium: 4.9 mmol/L (ref 3.5–5.1)
Sodium: 139 mmol/L (ref 135–145)

## 2020-03-28 LAB — CBC WITH DIFFERENTIAL/PLATELET
Abs Immature Granulocytes: 0.03 10*3/uL (ref 0.00–0.07)
Basophils Absolute: 0 10*3/uL (ref 0.0–0.1)
Basophils Relative: 0 %
Eosinophils Absolute: 0.1 10*3/uL (ref 0.0–0.5)
Eosinophils Relative: 2 %
HCT: 26.4 % — ABNORMAL LOW (ref 39.0–52.0)
Hemoglobin: 8.4 g/dL — ABNORMAL LOW (ref 13.0–17.0)
Immature Granulocytes: 1 %
Lymphocytes Relative: 23 %
Lymphs Abs: 1 10*3/uL (ref 0.7–4.0)
MCH: 27.3 pg (ref 26.0–34.0)
MCHC: 31.8 g/dL (ref 30.0–36.0)
MCV: 85.7 fL (ref 80.0–100.0)
Monocytes Absolute: 0.3 10*3/uL (ref 0.1–1.0)
Monocytes Relative: 7 %
Neutro Abs: 2.9 10*3/uL (ref 1.7–7.7)
Neutrophils Relative %: 67 %
Platelets: 152 10*3/uL (ref 150–400)
RBC: 3.08 MIL/uL — ABNORMAL LOW (ref 4.22–5.81)
RDW: 14.9 % (ref 11.5–15.5)
WBC: 4.2 10*3/uL (ref 4.0–10.5)
nRBC: 0 % (ref 0.0–0.2)

## 2020-03-28 LAB — CK: Total CK: 82 U/L (ref 49–397)

## 2020-03-28 MED ORDER — FLUORESCEIN SODIUM 1 MG OP STRP
1.0000 | ORAL_STRIP | Freq: Once | OPHTHALMIC | Status: AC
  Administered 2020-03-28: 1 via OPHTHALMIC
  Filled 2020-03-28: qty 1

## 2020-03-28 NOTE — ED Triage Notes (Signed)
Pt BIBA via [facility] from [facility] c/o of fall from last night. Facility called out for "blood pooling in L eye". Pt presents with minimal swelling and redness in L eye. Pt is currently at baseline being bedbound and confused. EMS states that pt has past medical hx of unspecified eye disorder. PTA EMS states vitals signs as follows: BP 160/86, cbg 179, HR in 90s, 100% on RA.

## 2020-03-28 NOTE — Discharge Instructions (Signed)
Dwayne Dominguez has a normal exam and CT scans.  Labs also reassuring at this time.  No signs of acute injury to the fall.  He has a self conjunctival hemorrhage, which will resolve without intervention.  No indication of any acute eye injury.

## 2020-03-28 NOTE — ED Notes (Signed)
This RN attempted to call report to [facility] x1 with no response.

## 2020-03-28 NOTE — ED Provider Notes (Signed)
[facility] Emergency Department Provider Note  ____________________________________________   None    (approximate)  I have reviewed the triage vital signs and the nursing notes.   HISTORY  Chief Complaint Fall  History is presented by the daughter.  She is present for interview and exam.  HPI Dwayne Dominguez is a 87 y.o. male patient presents via EMS from [facility], following a unwitnessed fall overnight.  Patient apparently has a history of multiple falls and, and commonly falls out of his bed.  His room is equipped with fall pads on the floor, but the patient is otherwise on encumbered in the bed.  He presents after his daughter was notified about earlier today about a fall the patient apparently had overnight.  Patient last been in the presence of her father about 730 last night.  No other reported injuries at this time.  The staff called EMS after the patient was noted to have a red eye with "blood pooling in the left eye."  Cording to the daughter, the patient is sent his baseline in terms of activity and cognition.  Patient has a prosthetic right eye.     History reviewed. No pertinent past medical history.  There are no problems to display for this patient.   History reviewed. No pertinent surgical history.  Prior to Admission medications   Not on File    Allergies Patient has no allergy information on record.  History reviewed. No pertinent family history.  Social History    Review of Systems  Constitutional: No fever/chills Eyes: No visual changes. Subconjunctival hemorrhage on the left eye ENT: No sore throat. Cardiovascular: Denies chest pain. Respiratory: Denies shortness of breath. Gastrointestinal: No abdominal pain.  No nausea, no vomiting.  No diarrhea.  No constipation. Genitourinary: Negative for dysuria. Musculoskeletal: Negative for back pain. Skin: Negative for rash. Neurological: Negative for headaches, focal  weakness or numbness. ____________________________________________   PHYSICAL EXAM:  VITAL SIGNS: ED Triage Vitals [03/28/20 1928]  Enc Vitals Group     BP (!) 150/80     Pulse Rate 96     Resp 16     Temp      Temp src      SpO2 100 %     Weight      Height      Head Circumference      [facility] Flow      Pain Score      Pain Loc      Pain Edu?      Excl. in GC?    Constitutional: Alert and oriented. Well appearing and in no acute distress. Eyes: Conjunctiva on the left is hemorrhagic, consistent with a SCH. PERRL. EOMI. Right eye is prosthetic.  No fluorescein dye uptake on the left eye. Head: Atraumatic. Nose: No congestion/rhinnorhea. Mouth/Throat: Mucous membranes are moist.  Oropharynx non-erythematous. Neck: No stridor.  No cervical spine tenderness to palpation. Cardiovascular: Normal rate, regular rhythm. Grossly normal heart sounds.  Good peripheral circulation. Respiratory: Normal respiratory effort.  No retractions. Lungs CTAB. Gastrointestinal: Soft and nontender. No distention. No abdominal bruits. No CVA tenderness. Musculoskeletal: No lower extremity tenderness nor edema.  No joint effusions. Neurologic:  Normal speech and language. No gross focal neurologic deficits are appreciated.  Skin:  Skin is warm, dry and intact. No rash noted. Psychiatric: Mood and affect are normal. Speech and behavior are normal.  ____________________________________________   LABS (all labs ordered are listed, but only abnormal results are displayed)  Labs Reviewed  BASIC METABOLIC PANEL - Abnormal; Notable for the following components:      Result Value   CO2 20 (*)    Glucose, Bld 194 (*)    BUN 52 (*)    Creatinine, Ser 2.10 (*)    Calcium 8.4 (*)    GFR, Estimated 29 (*)    All other components within normal limits  CBC WITH DIFFERENTIAL/PLATELET - Abnormal; Notable for the following components:   RBC 3.08 (*)    Hemoglobin 8.4 (*)    HCT 26.4 (*)    All other  components within normal limits  CK   ____________________________________________  EKG  ____________________________________________  RADIOLOGY I, Lissa Hoard, personally viewed and evaluated these images (plain radiographs) as part of my medical decision making, as well as reviewing the written report by the radiologist.  ED MD interpretation: Agree with CT results.  Official radiology report(s): CT Head Wo Contrast  Result Date: 03/28/2020 CLINICAL DATA:  Head trauma, minor (Age >= 9y) 87 year old post fall last night.  Swelling about left eye. EXAM: CT HEAD WITHOUT CONTRAST TECHNIQUE: Contiguous axial images were obtained from the base of the skull through the vertex without intravenous contrast. COMPARISON:  Head CT 03/14/2012. FINDINGS: Brain: Age related atrophy. Moderate periventricular and deep white matter changes consistent with chronic small vessel ischemia. Remote lacunar infarct in the right greater than left basal ganglia. No intracranial hemorrhage, mass effect, or midline shift. No hydrocephalus. The basilar cisterns are patent. No evidence of territorial infarct or acute ischemia. No extra-axial or intracranial fluid collection. Vascular: Atherosclerosis of skullbase vasculature without hyperdense vessel or abnormal calcification. Skull: No fracture or focal lesion. Sinuses/Orbits: Postsurgical change of the right globe, stable in appearance from 2014 exam. Prior left cataract resection. No evidence of acute orbital finding. Trace ethmoid sinus mucosal thickening. The mastoid air cells are clear. Other: None. IMPRESSION: 1. No acute intracranial abnormality. No skull fracture. 2. Age related atrophy and chronic small vessel ischemia. Remote lacunar infarcts in the basal ganglia. Electronically Signed   By: Narda Rutherford M.D.   On: 03/28/2020 20:50   CT Cervical Spine Wo Contrast  Result Date: 03/28/2020 CLINICAL DATA:  Neck trauma (Age >= 65y) unwitnessed fall  out of bed EXAM: CT CERVICAL SPINE WITHOUT CONTRAST TECHNIQUE: Multidetector CT imaging of the cervical spine was performed without intravenous contrast. Multiplanar CT image reconstructions were also generated. COMPARISON:  None. FINDINGS: Alignment: Reversal of normal lordosis. Trace anterolisthesis of C3 on C4. No traumatic subluxation. Skull base and vertebrae: No acute fracture. Dens and skull base are intact. Soft tissues and spinal canal: No prevertebral fluid or swelling. No visible canal hematoma. Disc levels: Advanced degenerative disc disease C4-C5 through C7-T1. Near complete disc space loss at C5-C6 a partial fusion of the vertebral bodies. Prominent multilevel facet hypertrophy. There is multilevel neural foraminal stenosis. Mild central canal stenosis at C6-C7 Upper chest: No acute or unexpected findings. Other: Carotid calcifications. IMPRESSION: 1. No acute fracture or subluxation of the cervical spine. 2. Advanced degenerative disc disease and facet hypertrophy with reversal of normal lordosis. Electronically Signed   By: Narda Rutherford M.D.   On: 03/28/2020 20:57    ____________________________________________   PROCEDURES  Procedure(s) performed (including Critical Care):  Procedures   ____________________________________________   INITIAL IMPRESSION / ASSESSMENT AND PLAN / ED COURSE  As part of my medical decision making, I reviewed the following data within the electronic MEDICAL RECORD NUMBER History obtained from family, Notes from prior  ED visits and [place] Controlled Substance Database      Geriatric patient ED evaluation of a nonrelated witnessed fall from his nursing facility.  Patient presents with only complaint of subconjunctival hemorrhage of the left eye.  CT imaging of the head and neck are negative reassuring at this time.  Labs also reassuring including negative CK.  He is discharged to the care of his daughter to follow-up with primary provider as needed.   Precautions have been discussed.    ____________________________________________   FINAL CLINICAL IMPRESSION(S) / ED DIAGNOSES  Final diagnoses:  Fall in home, initial encounter  Subconjunctival hemorrhage of left eye     ED Discharge Orders    None      *Please note:  JONAVIN SEDER was evaluated in Emergency Department on 03/28/2020 for the symptoms described in the history of present illness. He was evaluated in the context of the global COVID-19 pandemic, which necessitated consideration that the patient might be at risk for infection with the SARS-CoV-2 virus that causes COVID-19. Institutional protocols and algorithms that pertain to the evaluation of patients at risk for COVID-19 are in a state of rapid change based on information released by regulatory bodies including the CDC and federal and state organizations. These policies and algorithms were followed during the patient's care in the ED.  Some ED evaluations and interventions may be delayed as a result of limited staffing during and the pandemic.*   Note:  This document was prepared using Dragon voice recognition software and may include unintentional dictation errors.    Lissa Hoard, PA-C 03/28/20 2115    Minna Antis, MD 03/28/20 2234

## 2020-03-28 NOTE — ED Notes (Signed)
Lab called to collect blood work due to pt being difficult stick. Lab stated they will send someone to collect blood once someone becomes available.

## 2020-08-13 DEATH — deceased
